# Patient Record
Sex: Female | Born: 1985 | Race: Black or African American | Hispanic: No | Marital: Single | Smoking: Current every day smoker
Health system: Southern US, Community
[De-identification: ages and names within clinical notes are randomized; demographics above are authoritative.]

## PROBLEM LIST (undated history)

## (undated) DIAGNOSIS — F319 Bipolar disorder, unspecified: Secondary | ICD-10-CM

## (undated) HISTORY — PX: CHOLECYSTECTOMY: SHX55

---

## 2013-05-24 ENCOUNTER — Emergency Department (HOSPITAL_COMMUNITY)
Admission: EM | Admit: 2013-05-24 | Discharge: 2013-05-24 | Disposition: A | Discharge status: Home / Self Care | Payer: Medicaid Other | Attending: Emergency Medicine | Admitting: Emergency Medicine

## 2013-05-24 ENCOUNTER — Encounter (HOSPITAL_COMMUNITY): Payer: Self-pay | Admitting: Emergency Medicine

## 2013-05-24 DIAGNOSIS — F17200 Nicotine dependence, unspecified, uncomplicated: Secondary | ICD-10-CM | POA: Insufficient documentation

## 2013-05-24 DIAGNOSIS — H9319 Tinnitus, unspecified ear: Secondary | ICD-10-CM | POA: Insufficient documentation

## 2013-05-24 DIAGNOSIS — R350 Frequency of micturition: Secondary | ICD-10-CM | POA: Insufficient documentation

## 2013-05-24 DIAGNOSIS — J020 Streptococcal pharyngitis: Secondary | ICD-10-CM

## 2013-05-24 LAB — RAPID STREP SCREEN (MED CTR MEBANE ONLY): Streptococcus, Group A Screen (Direct): POSITIVE — AB

## 2013-05-24 MED ORDER — DEXAMETHASONE SODIUM PHOSPHATE 10 MG/ML IJ SOLN
10.0000 mg | Freq: Once | INTRAMUSCULAR | Status: AC
  Administered 2013-05-24: 10 mg via INTRAMUSCULAR
  Filled 2013-05-24: qty 1

## 2013-05-24 MED ORDER — HYDROCODONE-ACETAMINOPHEN 5-325 MG PO TABS: ORAL_TABLET | ORAL | Status: DC

## 2013-05-24 MED ORDER — DEXAMETHASONE SODIUM PHOSPHATE 10 MG/ML IJ SOLN: 10.0000 mg | Freq: Once | INTRAMUSCULAR | Status: DC

## 2013-05-24 MED ORDER — PENICILLIN G BENZATHINE 1200000 UNIT/2ML IM SUSP
1.2000 10*6.[IU] | Freq: Once | INTRAMUSCULAR | Status: AC
  Administered 2013-05-24: 1.2 10*6.[IU] via INTRAMUSCULAR
  Filled 2013-05-24: qty 2

## 2013-05-24 NOTE — ED Notes (Signed)
Pt reports sore throat since yesterday and ringing in right ear.

## 2013-05-24 NOTE — ED Provider Notes (Signed)
CSN: [id number]     Arrival date & time 05/24/13  1043 History  This chart was scribed for non-physician practitioner working with Derwood Kaplan, MD by Ashley Jacobs, ED scribe. This patient was seen in room TR10C/TR10C and the patient's care was started at 11:14 AM.  First MD Initiated Contact with Patient 05/24/13 1101     Chief Complaint  Patient presents with  . Sore Throat   (Consider location/radiation/quality/duration/timing/severity/associated sxs/prior Treatment) The history is provided by the patient and medical records. No language interpreter was used.   HPI Comments: Jackie Nolan is a 27 y.o. female who presents to the Emergency Department complaining of sore throat with onset of yesterday and is much worse this morning upon waking. Pt states having painful swallowing and being unwilling to eat/drink due to pain. Yesterday she noticed white patches to the back of her throat. Yesterday her throat was itchy and today it is painful.  She has the associated symptom of tinnitus in her right ear. Pt states having urinary frequency and chills. She has medical allergies to ibuprofen. Pt does not have any prior medical complications. She smokes tobacco everyday and does not drink alcohol. Pt tried oral sex for the first time the night prior to the onset of her symptoms. She is concerned that her infection is an STD.   History reviewed. No pertinent past medical history. Past Surgical History  Procedure Laterality Date  . Cholecystectomy     No family history on file. History  Substance Use Topics  . Smoking status: Current Every Day Smoker  . Smokeless tobacco: Not on file  . Alcohol Use: No   OB History   Grav Para Term Preterm Abortions TAB SAB Ect Mult Living                 Review of Systems  Constitutional: Positive for chills. Negative for fever and fatigue.  HENT: Positive for sore throat, tinnitus and trouble swallowing. Negative for congestion, ear pain, rhinorrhea  and sinus pressure.   Eyes: Negative for redness.  Respiratory: Negative for cough and wheezing.   Gastrointestinal: Negative for nausea, vomiting, abdominal pain and diarrhea.  Genitourinary: Negative for dysuria.  Musculoskeletal: Negative for myalgias and neck stiffness.  Skin: Negative for rash.  Neurological: Negative for headaches.  Hematological: Negative for adenopathy.  All other systems reviewed and are negative.    Allergies  Ibuprofen  Home Medications   Current Outpatient Rx  Name  Route  Sig  Dispense  Refill  . HYDROcodone-acetaminophen (NORCO/VICODIN) 5-325 MG per tablet      Take 1-2 tablets every 6 hours as needed for severe pain   8 tablet   0    BP 132/87  Pulse 88  Temp(Src) 98 F (36.7 C) (Oral)  Resp 16  SpO2 99% Physical Exam  Nursing note and vitals reviewed. Constitutional: She appears well-developed and well-nourished. No distress.  HENT:  Head: Normocephalic and atraumatic.  Right Ear: Tympanic membrane, external ear and ear canal normal.  Left Ear: Tympanic membrane, external ear and ear canal normal.  Nose: Nose normal. No mucosal edema or rhinorrhea.  Mouth/Throat: Uvula is midline and mucous membranes are normal. Mucous membranes are not dry. No oral lesions. No trismus in the jaw. No uvula swelling. Oropharyngeal exudate, posterior oropharyngeal edema and posterior oropharyngeal erythema present. No tonsillar abscesses.  Eyes: Conjunctivae and EOM are normal. Pupils are equal, round, and reactive to light. Right eye exhibits no discharge. Left eye exhibits no discharge.  Neck: Normal range of motion. Neck supple. No tracheal deviation present.  Cardiovascular: Normal rate, regular rhythm and normal heart sounds.   Pulmonary/Chest: Effort normal and breath sounds normal. No respiratory distress. She has no wheezes. She has no rales.  Abdominal: Soft. She exhibits no distension. There is no tenderness.  Musculoskeletal: Normal range of  motion.  Lymphadenopathy:    She has cervical adenopathy.  Neurological: She is alert.  Skin: Skin is warm and dry.  Psychiatric: She has a normal mood and affect. Her behavior is normal.    ED Course  Procedures (including critical care time) DIAGNOSTIC STUDIES: Oxygen Saturation is 99% on room air, normal by my interpretation.    COORDINATION OF CARE: 11:27 AM Discussed course of care with pt . Pt understands and agrees.  Labs Review Labs Reviewed  RAPID STREP SCREEN   Imaging Review No results found.  EKG Interpretation   None      Vital signs reviewed and are as follows: Filed Vitals:   05/24/13 1052  BP: 132/87  Pulse: 88  Temp: 98 F (36.7 C)  Resp: 16   CENTOR 3/4, postive strep test: will treat with abx and decadron given degree of swelling.    Patient urged to return with worsening symptoms, persistent fever, unable to swallow or other concerns. Patient verbalized understanding and agrees with plan.    MDM   1. Streptococcal pharyngitis    Positive strep and exam consistent with streptococcal pharyngitis. No complication of abscess. Doubt gonococcal pharyngitis given she would have had less than 1 day incubation and positive strep screen.   I personally performed the services described in this documentation, which was scribed in my presence. The recorded information has been reviewed and is accurate.    Renne Crigler, PA-C 05/24/13 1559

## 2013-05-24 NOTE — ED Provider Notes (Signed)
Medical screening examination/treatment/procedure(s) were performed by non-physician practitioner and as supervising physician I was immediately available for consultation/collaboration.  EKG Interpretation   None        Derwood Kaplan, MD 05/24/13 1647

## 2013-10-24 ENCOUNTER — Emergency Department (HOSPITAL_COMMUNITY)
Admission: EM | Admit: 2013-10-24 | Discharge: 2013-10-24 | Disposition: A | Discharge status: Home / Self Care | Payer: Medicaid Other | Attending: Emergency Medicine | Admitting: Emergency Medicine

## 2013-10-24 ENCOUNTER — Encounter (HOSPITAL_COMMUNITY): Payer: Self-pay | Admitting: Emergency Medicine

## 2013-10-24 DIAGNOSIS — J020 Streptococcal pharyngitis: Secondary | ICD-10-CM | POA: Insufficient documentation

## 2013-10-24 DIAGNOSIS — R61 Generalized hyperhidrosis: Secondary | ICD-10-CM | POA: Insufficient documentation

## 2013-10-24 DIAGNOSIS — O98819 Other maternal infectious and parasitic diseases complicating pregnancy, unspecified trimester: Secondary | ICD-10-CM | POA: Insufficient documentation

## 2013-10-24 DIAGNOSIS — O99330 Smoking (tobacco) complicating pregnancy, unspecified trimester: Secondary | ICD-10-CM | POA: Insufficient documentation

## 2013-10-24 DIAGNOSIS — O9989 Other specified diseases and conditions complicating pregnancy, childbirth and the puerperium: Secondary | ICD-10-CM | POA: Insufficient documentation

## 2013-10-24 DIAGNOSIS — H6120 Impacted cerumen, unspecified ear: Secondary | ICD-10-CM | POA: Insufficient documentation

## 2013-10-24 DIAGNOSIS — Z8659 Personal history of other mental and behavioral disorders: Secondary | ICD-10-CM | POA: Insufficient documentation

## 2013-10-24 HISTORY — DX: Bipolar disorder, unspecified: F31.9

## 2013-10-24 LAB — RAPID STREP SCREEN (MED CTR MEBANE ONLY): STREPTOCOCCUS, GROUP A SCREEN (DIRECT): POSITIVE — AB

## 2013-10-24 MED ORDER — LIDOCAINE VISCOUS 2 % MT SOLN
15.0000 mL | Freq: Once | OROMUCOSAL | Status: AC
  Administered 2013-10-24: 15 mL via OROMUCOSAL
  Filled 2013-10-24: qty 15

## 2013-10-24 MED ORDER — PENICILLIN G BENZATHINE 1200000 UNIT/2ML IM SUSP
1.2000 10*6.[IU] | Freq: Once | INTRAMUSCULAR | Status: AC
  Administered 2013-10-24: 1.2 10*6.[IU] via INTRAMUSCULAR
  Filled 2013-10-24: qty 2

## 2013-10-24 MED ORDER — LIDOCAINE VISCOUS 2 % MT SOLN: 20.0000 mL | OROMUCOSAL | Status: DC | PRN

## 2013-10-24 NOTE — ED Notes (Signed)
Patient states has had sore throat x 2 days.   Patient states her R ear is now hurting also.

## 2013-10-24 NOTE — Discharge Instructions (Signed)
Please follow up with your primary care physician in 1-2 days. If you do not have one please [facility] the [facility] number listed above. Please use Xylocaine as prescribed to help with sore throat. A list of approved over the counter medications has been attached for symptomatic care. Please read all discharge instructions and return precautions.    Pharyngitis Pharyngitis is redness, pain, and swelling (inflammation) of your pharynx.  CAUSES  Pharyngitis is usually caused by infection. Most of the time, these infections are from viruses (viral) and are part of a cold. However, sometimes pharyngitis is caused by bacteria (bacterial). Pharyngitis can also be caused by allergies. Viral pharyngitis may be spread from person to person by coughing, sneezing, and personal items or utensils (cups, forks, spoons, toothbrushes). Bacterial pharyngitis may be spread from person to person by more intimate contact, such as kissing.  SIGNS AND SYMPTOMS  Symptoms of pharyngitis include:   Sore throat.   Tiredness (fatigue).   Low-grade fever.   Headache.  Joint pain and muscle aches.  Skin rashes.  Swollen lymph nodes.  Plaque-like film on throat or tonsils (often seen with bacterial pharyngitis). DIAGNOSIS  Your health care provider will ask you questions about your illness and your symptoms. Your medical history, along with a physical exam, is often all that is needed to diagnose pharyngitis. Sometimes, a rapid strep test is done. Other lab tests may also be done, depending on the suspected cause.  TREATMENT  Viral pharyngitis will usually get better in 3 4 days without the use of medicine. Bacterial pharyngitis is treated with medicines that kill germs (antibiotics).  HOME CARE INSTRUCTIONS   Drink enough water and fluids to keep your urine clear or pale yellow.   Only take over-the-counter or prescription medicines as directed by your health care provider:   If you are  prescribed antibiotics, make sure you finish them even if you start to feel better.   Do not take aspirin.   Get lots of rest.   Gargle with 8 oz of salt water ( tsp of salt per 1 qt of water) as often as every 1 2 hours to soothe your throat.   Throat lozenges (if you are not at risk for choking) or sprays may be used to soothe your throat. SEEK MEDICAL CARE IF:   You have large, tender lumps in your neck.  You have a rash.  You cough up green, yellow-brown, or bloody spit. SEEK IMMEDIATE MEDICAL CARE IF:   Your neck becomes stiff.  You drool or are unable to swallow liquids.  You vomit or are unable to keep medicines or liquids down.  You have severe pain that does not go away with the use of recommended medicines.  You have trouble breathing (not caused by a stuffy nose). MAKE SURE YOU:   Understand these instructions.  Will watch your condition.  Will get help right away if you are not doing well or get worse. Document Released: 06/02/2005 Document Revised: 03/23/2013 Document Reviewed: 02/07/2013 [facility] Patient Information 2014 [employer] [place], [place].

## 2013-10-24 NOTE — ED Provider Notes (Signed)
Medical screening examination/treatment/procedure(s) were performed by non-physician practitioner and as supervising physician I was immediately available for consultation/collaboration.   EKG Interpretation None        Vidit Boissonneault, MD 10/24/13 1612 

## 2013-10-24 NOTE — ED Provider Notes (Signed)
CSN: [id number]     Arrival date & time 10/24/13  0929 History   This chart was scribed for non-physician practitioner working with Glynn OctaveStephen Rancour, MD, by Jarvis Morganaylor Ferguson, ED Scribe. This patient was seen in room TR11C/TR11C and the patient's care was started at 9:52 AM    Chief Complaint  Patient presents with  . Sore Throat     The history is provided by the patient. No language interpreter was used.   HPI Comments: Jackie JacobsChardae Nolan is a 28 y.o. pregnant female who presents to the Emergency Department complaining of gradually worsening, "burning", sore throat onset 2 days ago. Patient states that she has associated right ear pain, tinnitus/congested sensation, subjective fever. Patient states that she has had strep throat before and it feels similar to that. Patient states that she took Halls at home with mild relief. Patient denies any rhinorrhea, cough or emesis. Patient is approximately 6-[redacted] weeks pregnant, but has no abdominal pain, pelvic pain, vaginal bleeding or discharge.   Past Medical History  Diagnosis Date  . Bipolar disorder    Past Surgical History  Procedure Laterality Date  . Cholecystectomy     No family history on file. History  Substance Use Topics  . Smoking status: Current Every Day Smoker -- 0.50 packs/day    Types: Cigarettes  . Smokeless tobacco: Not on file  . Alcohol Use: Yes   OB History   Grav Para Term Preterm Abortions TAB SAB Ect Mult Living   1              Review of Systems  Constitutional: Positive for fever (subjective) and diaphoresis.  HENT: Positive for ear pain (right ear), sore throat and tinnitus. Negative for rhinorrhea.   Respiratory: Negative for cough.   Gastrointestinal: Negative for vomiting.  All other systems reviewed and are negative.     Allergies  Ibuprofen  Home Medications   Prior to Admission medications   Medication Sig Start Date End Date Taking? Authorizing Provider  HYDROcodone-acetaminophen (NORCO/VICODIN)  5-325 MG per tablet Take 1-2 tablets every 6 hours as needed for severe pain 05/24/13   Renne CriglerJoshua Geiple, PA-C   Triage Vitals: BP 119/72  Pulse 84  Temp(Src) 98.6 F (37 C) (Oral)  Resp 18  Ht 5\' 5"  (1.651 m)  Wt 140 lb (63.504 kg)  BMI 23.30 kg/m2  SpO2 100%  Physical Exam  Nursing note and vitals reviewed. Constitutional: She is oriented to person, place, and time. She appears well-developed and well-nourished. No distress.  HENT:  Head: Normocephalic and atraumatic.  Right Ear: External ear and ear canal normal.  Left Ear: External ear and ear canal normal.  Nose: Nose normal.  Mouth/Throat: Uvula is midline and mucous membranes are normal. No trismus in the jaw. No uvula swelling. Oropharyngeal exudate and posterior oropharyngeal erythema present. No posterior oropharyngeal edema or tonsillar abscesses.  Bilateral cerumen impaction  Eyes: Conjunctivae are normal.  Neck: Normal range of motion. Neck supple.  Cardiovascular: Normal rate, regular rhythm and normal heart sounds.   Pulmonary/Chest: Effort normal and breath sounds normal.  Abdominal: Soft.  Musculoskeletal: Normal range of motion.  Lymphadenopathy:    She has cervical adenopathy.  Neurological: She is alert and oriented to person, place, and time. Gait normal. GCS eye subscore is 4. GCS verbal subscore is 5. GCS motor subscore is 6.  Skin: Skin is warm and dry. She is not diaphoretic.  Psychiatric: She has a normal mood and affect.    ED Course  Procedures (including critical care time) Medications  lidocaine (XYLOCAINE) 2 % viscous mouth solution 15 mL (15 mLs Mouth/Throat Given 10/24/13 1008)  penicillin g benzathine (BICILLIN LA) 1200000 UNIT/2ML injection 1.2 Million Units (1.2 Million Units Intramuscular Given 10/24/13 1022)     DIAGNOSTIC STUDIES: Oxygen Saturation is 100% on RA, normal by my interpretation.    COORDINATION OF CARE: 9:58 AM Will order strep test and lidocaine rinse.  Pt advised of plan  for treatment and pt agrees.  10:12 AM Diagnostic lab work showed pt positive for strep. Will order penicillin injection.     Labs Review Labs Reviewed  RAPID STREP SCREEN - Abnormal; Notable for the following:    Streptococcus, Group A Screen (Direct) POSITIVE (*)    All other components within normal limits    Imaging Review No results found.   EKG Interpretation None      MDM   Final diagnoses:  Strep pharyngitis    Filed Vitals:   10/24/13 1043  BP: 115/77  Pulse: 73  Temp:   Resp: 18   Pt with tonsillar exudate, cervical lymphadenopathy, & dysphagia; diagnosis of strep. Treated in the Ed with xylocaine and PCN IM.  Pt appears mildly dehydrated, discussed importance of water rehydration. Presentation non concerning for PTA or infxn spread to soft tissue. No trismus or uvula deviation. Specific return precautions discussed. Pt able to drink water in ED without difficulty with intact air way. Recommended PCP follow up. Patient is agreeable to plan. Patient is stable at time of discharge    I personally performed the services described in this documentation, which was scribed in my presence. The recorded information has been reviewed and is accurate.      Lise AuerJennifer L Yvett Rossel, PA-C 10/24/13 1050

## 2013-10-28 ENCOUNTER — Encounter (HOSPITAL_COMMUNITY): Payer: Self-pay | Admitting: Emergency Medicine

## 2013-10-28 ENCOUNTER — Emergency Department (HOSPITAL_COMMUNITY): Payer: Medicaid Other

## 2013-10-28 ENCOUNTER — Emergency Department (HOSPITAL_COMMUNITY)
Admission: EM | Admit: 2013-10-28 | Discharge: 2013-10-28 | Disposition: A | Discharge status: Home / Self Care | Payer: Medicaid Other | Attending: Emergency Medicine | Admitting: Emergency Medicine

## 2013-10-28 DIAGNOSIS — S93409A Sprain of unspecified ligament of unspecified ankle, initial encounter: Secondary | ICD-10-CM | POA: Insufficient documentation

## 2013-10-28 DIAGNOSIS — Z8659 Personal history of other mental and behavioral disorders: Secondary | ICD-10-CM | POA: Insufficient documentation

## 2013-10-28 DIAGNOSIS — F17200 Nicotine dependence, unspecified, uncomplicated: Secondary | ICD-10-CM | POA: Insufficient documentation

## 2013-10-28 NOTE — Discharge Instructions (Signed)
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
## 2013-10-29 NOTE — ED Provider Notes (Signed)
Medical screening examination/treatment/procedure(s) were performed by non-physician practitioner and as supervising physician I was immediately available for consultation/collaboration.  Terrah Decoster L Fantasia Jinkins, MD 10/29/13 0127 

## 2014-01-03 ENCOUNTER — Encounter (HOSPITAL_COMMUNITY): Payer: Self-pay | Admitting: Emergency Medicine

## 2014-01-03 ENCOUNTER — Emergency Department (HOSPITAL_COMMUNITY)
Admission: EM | Admit: 2014-01-03 | Discharge: 2014-01-03 | Disposition: A | Discharge status: Home / Self Care | Payer: Medicaid Other | Attending: Emergency Medicine | Admitting: Emergency Medicine

## 2014-01-03 DIAGNOSIS — R52 Pain, unspecified: Secondary | ICD-10-CM | POA: Insufficient documentation

## 2014-01-03 DIAGNOSIS — Z79899 Other long term (current) drug therapy: Secondary | ICD-10-CM | POA: Diagnosis not present

## 2014-01-03 DIAGNOSIS — R112 Nausea with vomiting, unspecified: Secondary | ICD-10-CM

## 2014-01-03 DIAGNOSIS — F17200 Nicotine dependence, unspecified, uncomplicated: Secondary | ICD-10-CM | POA: Diagnosis not present

## 2014-01-03 DIAGNOSIS — Z8659 Personal history of other mental and behavioral disorders: Secondary | ICD-10-CM | POA: Insufficient documentation

## 2014-01-03 DIAGNOSIS — R197 Diarrhea, unspecified: Secondary | ICD-10-CM | POA: Insufficient documentation

## 2014-01-03 LAB — I-STAT CHEM 8, ED
BUN: 11 mg/dL (ref 6–23)
CALCIUM ION: 1.17 mmol/L (ref 1.12–1.23)
CHLORIDE: 106 meq/L (ref 96–112)
Creatinine, Ser: 0.6 mg/dL (ref 0.50–1.10)
Glucose, Bld: 77 mg/dL (ref 70–99)
HEMATOCRIT: 39 % (ref 36.0–46.0)
HEMOGLOBIN: 13.3 g/dL (ref 12.0–15.0)
Potassium: 4.2 mEq/L (ref 3.7–5.3)
Sodium: 139 mEq/L (ref 137–147)
TCO2: 25 mmol/L (ref 0–100)

## 2014-01-03 MED ORDER — SODIUM CHLORIDE 0.9 % IV BOLUS (SEPSIS)
1000.0000 mL | Freq: Once | INTRAVENOUS | Status: AC
  Administered 2014-01-03: 1000 mL via INTRAVENOUS

## 2014-01-03 MED ORDER — ONDANSETRON HCL 4 MG PO TABS: 4.0000 mg | ORAL_TABLET | Freq: Four times a day (QID) | ORAL | Status: AC

## 2014-01-03 MED ORDER — ONDANSETRON HCL 4 MG/2ML IJ SOLN
4.0000 mg | Freq: Once | INTRAMUSCULAR | Status: AC
  Administered 2014-01-03: 4 mg via INTRAVENOUS
  Filled 2014-01-03: qty 2

## 2014-01-03 NOTE — ED Provider Notes (Signed)
CSN: [id number]     Arrival date & time 01/03/14  1034 History   First MD Initiated Contact with Patient 01/03/14 1037     Chief Complaint  Patient presents with  . Nausea  . Emesis  . Diarrhea  . Generalized Body Aches     (Consider location/radiation/quality/duration/timing/severity/associated sxs/prior Treatment) HPI Comments: Patient presents today with a chief complaint of nausea, vomiting, and diarrhea.  She reports onset of symptoms last evening.  She states that she has had numerous episodes of vomiting and three episodes of diarrhea.  She denies any blood in her emesis or blood in her stool.  She has not taken anything for her symptoms prior to arrival.  She denies any known sick contacts.  No recent hospitalizations or foreign travel.  She denies abdominal pain, fever, chills, or urinary symptoms.  LMP was 12/29/13.  PMH significant for Cholecystectomy in 2005.  No other abdominal surgeries.    The history is provided by the patient.    Past Medical History  Diagnosis Date  . Bipolar disorder    Past Surgical History  Procedure Laterality Date  . Cholecystectomy     No family history on file. History  Substance Use Topics  . Smoking status: Current Every Day Smoker -- 0.50 packs/day    Types: Cigarettes  . Smokeless tobacco: Not on file  . Alcohol Use: Yes     Comment: social    OB History   Grav Para Term Preterm Abortions TAB SAB Ect Mult Living   1              Review of Systems  Constitutional: Negative for fever and chills.  Gastrointestinal: Positive for nausea, vomiting and diarrhea. Negative for abdominal pain.  Genitourinary: Negative for dysuria, urgency, frequency, hematuria and flank pain.  All other systems reviewed and are negative.     Allergies  Ibuprofen  Home Medications   Prior to Admission medications   Medication Sig Start Date End Date Taking? Authorizing Provider  ferrous sulfate 325 (65 FE) MG tablet Take 325 mg by mouth daily  with breakfast.    Historical Provider, MD  Prenatal Vit-Fe Fumarate-FA (PRENATAL MULTIVITAMIN) TABS tablet Take 1 tablet by mouth daily at 12 noon.    Historical Provider, MD   BP 120/72  Pulse 60  Temp(Src) 98 F (36.7 C) (Oral)  Resp 16  SpO2 100%  LMP 12/30/2013  Breastfeeding? Unknown Physical Exam  Nursing note and vitals reviewed. Constitutional: She appears well-developed and well-nourished.  HENT:  Head: Normocephalic and atraumatic.  Mouth/Throat: Oropharynx is clear and moist.  Neck: Normal range of motion. Neck supple.  Cardiovascular: Normal rate, regular rhythm and normal heart sounds.   Pulmonary/Chest: Effort normal and breath sounds normal.  Abdominal: Soft. Bowel sounds are normal. She exhibits no distension and no mass. There is no tenderness. There is no rebound and no guarding.  Neurological: She is alert.  Skin: Skin is warm and dry.  Psychiatric: She has a normal mood and affect.    ED Course  Procedures (including critical care time) Labs Review Labs Reviewed - No data to display  Imaging Review No results found.   EKG Interpretation None     12:30 PM Patient reports significant improvement in nausea.  Abdomen soft and nontender.  Will PO challenge and reassess.   1:00 PM Patient able to tolerate PO liquids.   MDM   Final diagnoses:  None   Patient presenting with a chief complaint of nausea,  vomiting, and diarrhea.  Patient afebrile.  Labs unremarkable.  No abdominal pain on exam.  Therefore, do not feel that any imaging is indicated at this time.  Suspect Viral Gastroenteritis.  Symptoms improved after given IV Zofran and IVF.  No vomiting during ED course.  Patient able to tolerate PO liquids.  Feel that the patient is stable for discharge.  Return precautions given.    Santiago Glad, PA-C 01/03/14 1911

## 2014-01-03 NOTE — ED Notes (Signed)
Pt brought in by [facility]. Complaints of N/V/D starting last night. Pt also has generalized body aches and chills.

## 2014-01-03 NOTE — ED Notes (Signed)
Bed: [id number] Expected date: 01/03/14 Expected time: 10:19 AM Means of arrival:  Comments: Female from home n/v fever

## 2014-01-03 NOTE — ED Notes (Signed)
Made patient aware that we need a urine sample 

## 2014-01-04 NOTE — ED Provider Notes (Signed)
Medical screening examination/treatment/procedure(s) were performed by non-physician practitioner and as supervising physician I was immediately available for consultation/collaboration.   EKG Interpretation None        Isaiha Asare N Garen Woolbright, DO 01/04/14 0658 

## 2014-04-17 ENCOUNTER — Encounter (HOSPITAL_COMMUNITY): Payer: Self-pay | Admitting: Emergency Medicine

## 2014-08-02 ENCOUNTER — Encounter (HOSPITAL_COMMUNITY): Payer: Self-pay | Admitting: *Deleted

## 2014-08-02 ENCOUNTER — Emergency Department (HOSPITAL_COMMUNITY)
Admission: EM | Admit: 2014-08-02 | Discharge: 2014-08-03 | Disposition: A | Discharge status: Home / Self Care | Payer: Medicaid Other | Attending: Emergency Medicine | Admitting: Emergency Medicine

## 2014-08-02 DIAGNOSIS — Z9049 Acquired absence of other specified parts of digestive tract: Secondary | ICD-10-CM | POA: Diagnosis not present

## 2014-08-02 DIAGNOSIS — N12 Tubulo-interstitial nephritis, not specified as acute or chronic: Secondary | ICD-10-CM | POA: Insufficient documentation

## 2014-08-02 DIAGNOSIS — Z8659 Personal history of other mental and behavioral disorders: Secondary | ICD-10-CM | POA: Diagnosis not present

## 2014-08-02 DIAGNOSIS — D72829 Elevated white blood cell count, unspecified: Secondary | ICD-10-CM | POA: Insufficient documentation

## 2014-08-02 DIAGNOSIS — Z720 Tobacco use: Secondary | ICD-10-CM | POA: Diagnosis not present

## 2014-08-02 DIAGNOSIS — Z3202 Encounter for pregnancy test, result negative: Secondary | ICD-10-CM | POA: Diagnosis not present

## 2014-08-02 DIAGNOSIS — R109 Unspecified abdominal pain: Secondary | ICD-10-CM | POA: Diagnosis present

## 2014-08-02 LAB — CBC WITH DIFFERENTIAL/PLATELET
BASOS ABS: 0 10*3/uL (ref 0.0–0.1)
Basophils Relative: 0 % (ref 0–1)
EOS ABS: 0.2 10*3/uL (ref 0.0–0.7)
EOS PCT: 3 % (ref 0–5)
HEMATOCRIT: 39.2 % (ref 36.0–46.0)
Hemoglobin: 13.4 g/dL (ref 12.0–15.0)
LYMPHS ABS: 1.9 10*3/uL (ref 0.7–4.0)
Lymphocytes Relative: 31 % (ref 12–46)
MCH: 30.7 pg (ref 26.0–34.0)
MCHC: 34.2 g/dL (ref 30.0–36.0)
MCV: 89.9 fL (ref 78.0–100.0)
MONO ABS: 0.7 10*3/uL (ref 0.1–1.0)
Monocytes Relative: 11 % (ref 3–12)
Neutro Abs: 3.4 10*3/uL (ref 1.7–7.7)
Neutrophils Relative %: 55 % (ref 43–77)
Platelets: 327 10*3/uL (ref 150–400)
RBC: 4.36 MIL/uL (ref 3.87–5.11)
RDW: 13.5 % (ref 11.5–15.5)
WBC: 6.2 10*3/uL (ref 4.0–10.5)

## 2014-08-02 LAB — COMPREHENSIVE METABOLIC PANEL
ALT: 10 U/L (ref 0–35)
ANION GAP: 9 (ref 5–15)
AST: 15 U/L (ref 0–37)
Albumin: 4.2 g/dL (ref 3.5–5.2)
Alkaline Phosphatase: 42 U/L (ref 39–117)
BUN: 7 mg/dL (ref 6–23)
CO2: 22 mmol/L (ref 19–32)
Calcium: 9.5 mg/dL (ref 8.4–10.5)
Chloride: 106 mmol/L (ref 96–112)
Creatinine, Ser: 0.77 mg/dL (ref 0.50–1.10)
GFR calc Af Amer: >90 mL/min (ref >90)
GFR calc non Af Amer: >90 mL/min (ref >90)
Glucose, Bld: 95 mg/dL (ref 70–99)
POTASSIUM: 3.5 mmol/L (ref 3.5–5.1)
SODIUM: 137 mmol/L (ref 135–145)
TOTAL PROTEIN: 7.6 g/dL (ref 6.0–8.3)
Total Bilirubin: 0.8 mg/dL (ref 0.3–1.2)

## 2014-08-02 LAB — URINALYSIS, ROUTINE W REFLEX MICROSCOPIC
GLUCOSE, UA: NEGATIVE mg/dL
KETONES UR: 15 mg/dL — AB
Nitrite: NEGATIVE
PROTEIN: NEGATIVE mg/dL
Specific Gravity, Urine: 1.029 (ref 1.005–1.030)
Urobilinogen, UA: 1 mg/dL (ref 0.0–1.0)
pH: 5.5 (ref 5.0–8.0)

## 2014-08-02 LAB — LIPASE, BLOOD: Lipase: 22 U/L (ref 11–59)

## 2014-08-02 LAB — PREGNANCY, URINE: Preg Test, Ur: NEGATIVE

## 2014-08-02 LAB — URINE MICROSCOPIC-ADD ON

## 2014-08-02 MED ORDER — HYDROMORPHONE HCL 1 MG/ML IJ SOLN
1.0000 mg | Freq: Once | INTRAMUSCULAR | Status: AC
  Administered 2014-08-02: 1 mg via INTRAVENOUS
  Filled 2014-08-02: qty 1

## 2014-08-02 MED ORDER — ONDANSETRON HCL 4 MG/2ML IJ SOLN
4.0000 mg | Freq: Once | INTRAMUSCULAR | Status: AC
Start: 2014-08-02 — End: 2014-08-02
  Administered 2014-08-02: 4 mg via INTRAVENOUS
  Filled 2014-08-02: qty 2

## 2014-08-02 MED ORDER — DEXTROSE 5 % IV SOLN
1.0000 g | Freq: Once | INTRAVENOUS | Status: AC
  Administered 2014-08-02: 1 g via INTRAVENOUS
  Filled 2014-08-02: qty 10

## 2014-08-02 MED ORDER — MORPHINE SULFATE 4 MG/ML IJ SOLN
4.0000 mg | Freq: Once | INTRAMUSCULAR | Status: AC
  Administered 2014-08-02: 4 mg via INTRAVENOUS
  Filled 2014-08-02: qty 1

## 2014-08-02 MED ORDER — ONDANSETRON HCL 4 MG/2ML IJ SOLN: 4.0000 mg | Freq: Once | INTRAMUSCULAR | Status: DC

## 2014-08-02 MED ORDER — SODIUM CHLORIDE 0.9 % IV BOLUS (SEPSIS)
1000.0000 mL | Freq: Once | INTRAVENOUS | Status: AC
  Administered 2014-08-02: 1000 mL via INTRAVENOUS

## 2014-08-02 NOTE — ED Provider Notes (Signed)
CSN: [id number]     Arrival date & time 08/02/14  1747 History   First MD Initiated Contact with Patient 08/02/14 2101     Chief Complaint  Patient presents with  . Abdominal Pain     (Consider location/radiation/quality/duration/timing/severity/associated sxs/prior Treatment) HPI  PCP: No PCP Per Patient Blood pressure 104/65, pulse 63, temperature 97.8 F (36.6 C), temperature source Oral, resp. rate 18, last menstrual period 07/02/2014, SpO2 99 %, unknown if currently breastfeeding.  Jackie Nolan is a 29 y.o.female with a significant PMH of bipolar disorder presents to the ER with complaints of abdominal pain and left sided flank pain for the past 4 days with intermittent nausea that started a few days ago. She admits to having some dysuria and low back pain associated with it.   Negative Review of Symptoms: fevers, nausea, vomiting,CP, SOB, confusion, weakness, vaginal discharge or vaginal bleeding.  Past Medical History  Diagnosis Date  . Bipolar disorder    Past Surgical History  Procedure Laterality Date  . Cholecystectomy     No family history on file. History  Substance Use Topics  . Smoking status: Current Every Day Smoker -- 0.50 packs/day    Types: Cigarettes  . Smokeless tobacco: Not on file  . Alcohol Use: Yes     Comment: social    OB History    Gravida Para Term Preterm AB TAB SAB Ectopic Multiple Living   1              Review of Systems  10 Systems reviewed and are negative for acute change except as noted in the HPI.     Allergies  Ibuprofen  Home Medications   Prior to Admission medications   Medication Sig Start Date End Date Taking? Authorizing Provider  ciprofloxacin (CIPRO) 500 MG tablet Take 1 tablet (500 mg total) by mouth 2 (two) times daily. 08/03/14   Kebin Maye Irine SealG Wilfrido Luedke, PA-C  ondansetron (ZOFRAN) 4 MG tablet Take 1 tablet (4 mg total) by mouth every 6 (six) hours. 01/03/14   Heather Laisure, PA-C  ondansetron (ZOFRAN) 4 MG tablet Take  1 tablet (4 mg total) by mouth every 6 (six) hours. 08/03/14   Janie Strothman Irine SealG Zan Orlick, PA-C  oxyCODONE-acetaminophen (PERCOCET/ROXICET) 5-325 MG per tablet Take 1-2 tablets by mouth every 6 (six) hours as needed. 08/03/14   Alysah Carton Irine SealG Yousuf Ager, PA-C   BP 103/65 mmHg  Pulse 59  Temp(Src) 97.8 F (36.6 C) (Oral)  Resp 18  SpO2 98%  LMP 07/02/2014 Physical Exam  Constitutional: She appears well-developed and well-nourished. No distress.  HENT:  Head: Normocephalic and atraumatic.  Eyes: Pupils are equal, round, and reactive to light.  Neck: Normal range of motion. Neck supple.  Cardiovascular: Normal rate and regular rhythm.   Pulmonary/Chest: Effort normal.  Abdominal: Soft. Bowel sounds are normal. There is no tenderness. There is CVA tenderness (left worse than right). There is no rigidity, no guarding and negative Murphy's sign.  Neurological: She is alert.  Skin: Skin is warm and dry.  Nursing note and vitals reviewed.   ED Course  Procedures (including critical care time) Labs Review Labs Reviewed  URINALYSIS, ROUTINE W REFLEX MICROSCOPIC - Abnormal; Notable for the following:    Color, Urine AMBER (*)    APPearance CLOUDY (*)    Hgb urine dipstick TRACE (*)    Bilirubin Urine SMALL (*)    Ketones, ur 15 (*)    Leukocytes, UA MODERATE (*)    All other components within normal limits  URINE MICROSCOPIC-ADD ON - Abnormal; Notable for the following:    Squamous Epithelial / LPF MANY (*)    Bacteria, UA FEW (*)    All other components within normal limits  URINE CULTURE  CBC WITH DIFFERENTIAL/PLATELET  COMPREHENSIVE METABOLIC PANEL  LIPASE, BLOOD  PREGNANCY, URINE    Imaging Review No results found.   EKG Interpretation None      MDM   Final diagnoses:  Pyelonephritis   Patient has a normal lipase, CMP, CBC, Urine prep negative. THe Urinalysis shows moderate leukocytes with 11-20 bacteria. She also has a small amount of trace hgb. Due to the quality of the patients  pain I question if she has possible stone and hydronephrosis. CT abd/pelv without contrast has ruled this out.  Medications  sodium chloride 0.9 % bolus 1,000 mL (0 mLs Intravenous Stopped 08/03/14 0112)  morphine 4 MG/ML injection 4 mg (4 mg Intravenous Given 08/02/14 2148)  ondansetron (ZOFRAN) injection 4 mg (4 mg Intravenous Given 08/02/14 2148)  cefTRIAXone (ROCEPHIN) 1 g in dextrose 5 % 50 mL IVPB (0 g Intravenous Stopped 08/02/14 2351)  HYDROmorphone (DILAUDID) injection 1 mg (1 mg Intravenous Given 08/02/14 2233)    Patient will be treated for Pyelo with IV abx int he ED and rx for Cipro x 2 weeks for home. She is afebrile, nontoxic, her pain is controlled and she tolerating PO at discharge.  28 y.o.Jackie Nolan's evaluation in the Emergency Department is complete. It has been determined that no acute conditions requiring further emergency intervention are present at this time. The patient/guardian have been advised of the diagnosis and plan. We have discussed signs and symptoms that warrant return to the ED, such as changes or worsening in symptoms.  Vital signs are stable at discharge. Filed Vitals:   08/03/14 0030  BP: 103/65  Pulse: 59  Temp:   Resp:     Patient/guardian has voiced understanding and agreed to follow-up with the PCP or specialist.     Dorthula Matas, PA-C 08/07/14 1610  Donnetta Hutching, MD 08/10/14 [phone]

## 2014-08-02 NOTE — ED Notes (Signed)
The pt is c/o abd and lower back pain for 4 days with intermittent nausea lmp last month

## 2014-08-03 ENCOUNTER — Emergency Department (HOSPITAL_COMMUNITY): Payer: Medicaid Other

## 2014-08-03 MED ORDER — CIPROFLOXACIN HCL 500 MG PO TABS: 500.0000 mg | ORAL_TABLET | Freq: Two times a day (BID) | ORAL | Status: AC

## 2014-08-03 MED ORDER — OXYCODONE-ACETAMINOPHEN 5-325 MG PO TABS: 1.0000 | ORAL_TABLET | Freq: Four times a day (QID) | ORAL | Status: AC | PRN

## 2014-08-03 MED ORDER — ONDANSETRON HCL 4 MG PO TABS: 4.0000 mg | ORAL_TABLET | Freq: Four times a day (QID) | ORAL | Status: AC

## 2014-08-03 NOTE — ED Notes (Signed)
Pt ambulated to bathroom 

## 2014-08-03 NOTE — Discharge Instructions (Signed)
Pyelonephritis, Adult °Pyelonephritis is a kidney infection. In general, there are 2 main types of pyelonephritis: °· Infections that come on quickly without any warning (acute pyelonephritis). °· Infections that persist for a long period of time (chronic pyelonephritis). °CAUSES  °Two main causes of pyelonephritis are: °· Bacteria traveling from the bladder to the kidney. This is a problem especially in pregnant women. The urine in the bladder can become filled with bacteria from multiple causes, including: °¨ Inflammation of the prostate gland (prostatitis). °¨ Sexual intercourse in females. °¨ Bladder infection (cystitis). °· Bacteria traveling from the bloodstream to the tissue part of the kidney. °Problems that may increase your risk of getting a kidney infection include: °· Diabetes. °· Kidney stones or bladder stones. °· Cancer. °· Catheters placed in the bladder. °· Other abnormalities of the kidney or ureter. °SYMPTOMS  °· Abdominal pain. °· Pain in the side or flank area. °· Fever. °· Chills. °· Upset stomach. °· Blood in the urine (dark urine). °· Frequent urination. °· Strong or persistent urge to urinate. °· Burning or stinging when urinating. °DIAGNOSIS  °Your caregiver may diagnose your kidney infection based on your symptoms. A urine sample may also be taken. °TREATMENT  °In general, treatment depends on how severe the infection is.  °· If the infection is mild and caught early, your caregiver may treat you with oral antibiotics and send you home. °· If the infection is more severe, the bacteria may have gotten into the bloodstream. This will require intravenous (IV) antibiotics and a hospital stay. Symptoms may include: °¨ High fever. °¨ Severe flank pain. °¨ Shaking chills. °· Even after a hospital stay, your caregiver may require you to be on oral antibiotics for a period of time. °· Other treatments may be required depending upon the cause of the infection. °HOME CARE INSTRUCTIONS  °· Take your  antibiotics as directed. Finish them even if you start to feel better. °· Make an appointment to have your urine checked to make sure the infection is gone. °· Drink enough fluids to keep your urine clear or pale yellow. °· Take medicines for the bladder if you have urgency and frequency of urination as directed by your caregiver. °SEEK IMMEDIATE MEDICAL CARE IF:  °· You have a fever or persistent symptoms for more than 2-3 days. °· You have a fever and your symptoms suddenly get worse. °· You are unable to take your antibiotics or fluids. °· You develop shaking chills. °· You experience extreme weakness or fainting. °· There is no improvement after 2 days of treatment. °MAKE SURE YOU: °· Understand these instructions. °· Will watch your condition. °· Will get help right away if you are not doing well or get worse. °Document Released: 06/02/2005 Document Revised: 12/02/2011 Document Reviewed: 11/06/2010 °[employer]® Patient Information ©2015 [employer]. This information is not intended to replace advice given to you by your health care provider. Make sure you discuss any questions you have with your health care provider. ° °

## 2014-08-04 LAB — URINE CULTURE

## 2016-08-07 IMAGING — CT CT ABD-PELV W/O CM
2 of 4 series · 6 of 46 positions shown, 8 images · non-contrast
Comparison: None.

CLINICAL DATA: Abdomen lower back pain for 4 days. Intermittent
nausea.

EXAM:
CT ABDOMEN AND PELVIS WITHOUT CONTRAST
TECHNIQUE: Multidetector CT imaging of the abdomen and pelvis was performed
following the standard protocol without IV contrast.

[Series 206: coronals · coronal · 0.50mm/px · 5 of 86 slices shown, 6 images]
[im 10/86  soft-tissue]
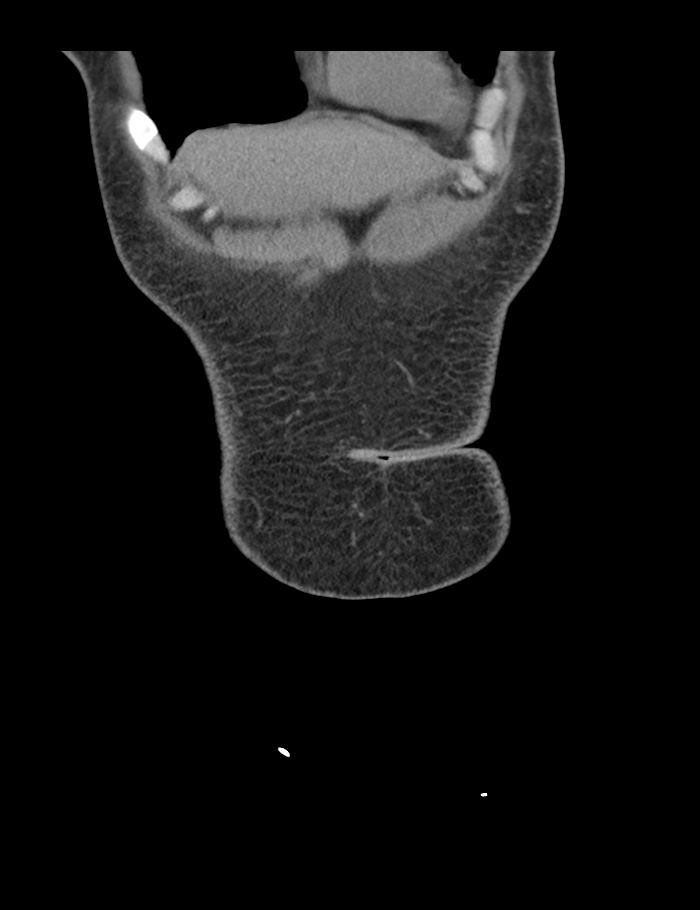
[im 10/86  bone]
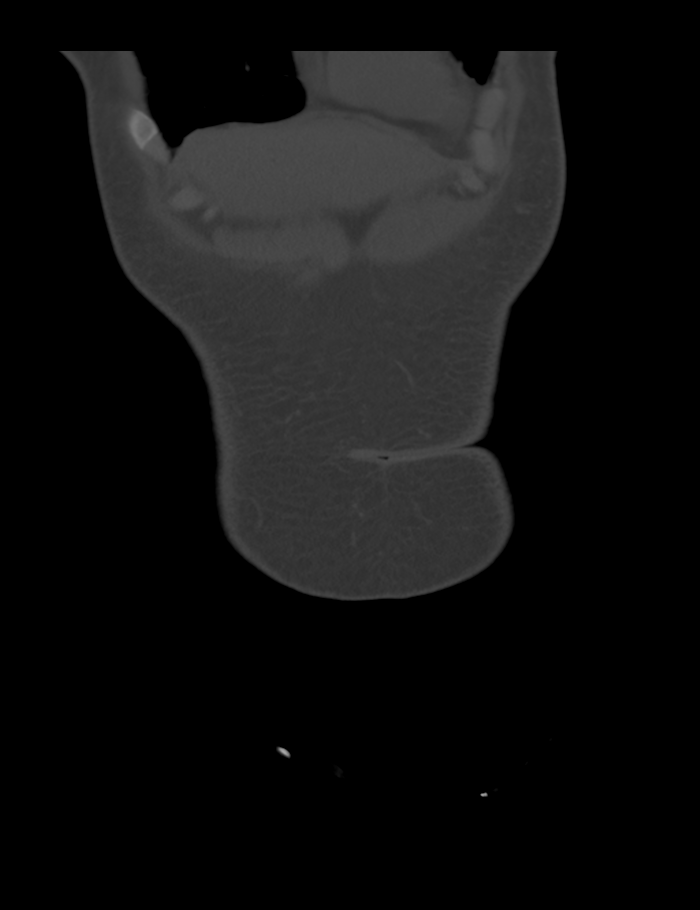
[im 29/86  soft-tissue]
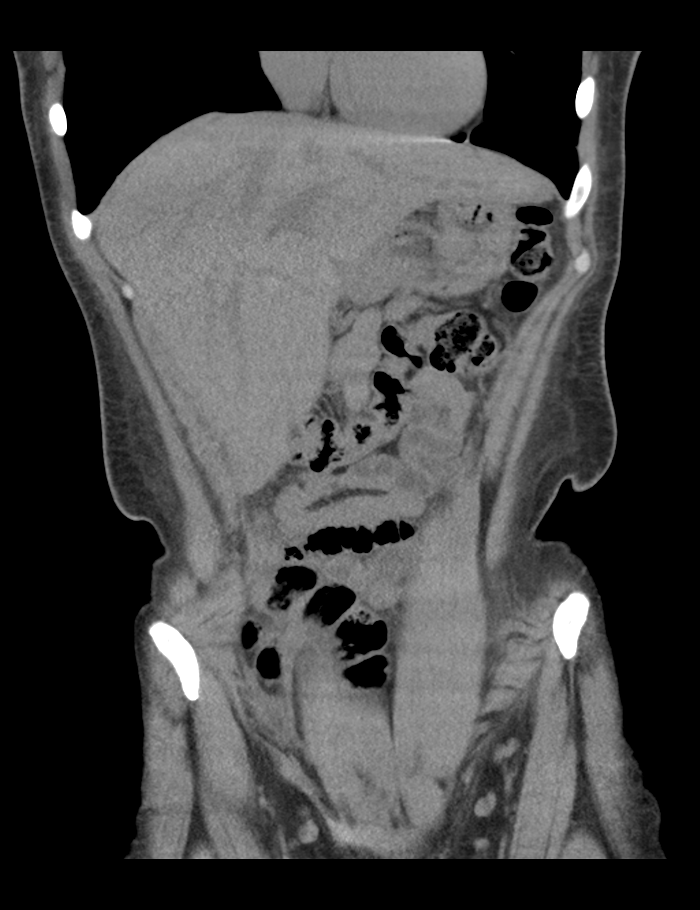
[im 48/86  soft-tissue]
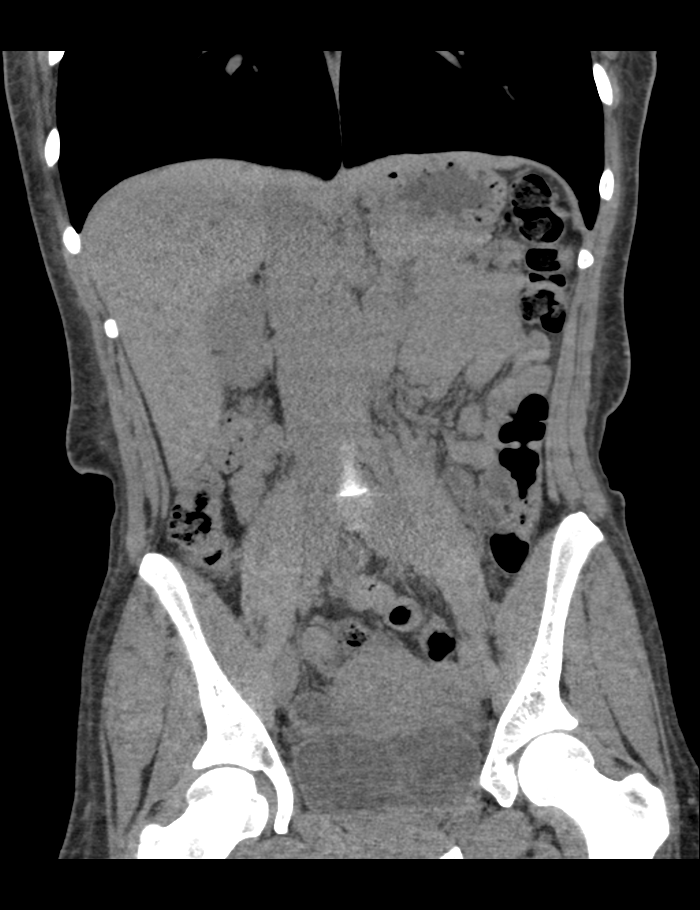
[im 57/86  soft-tissue]
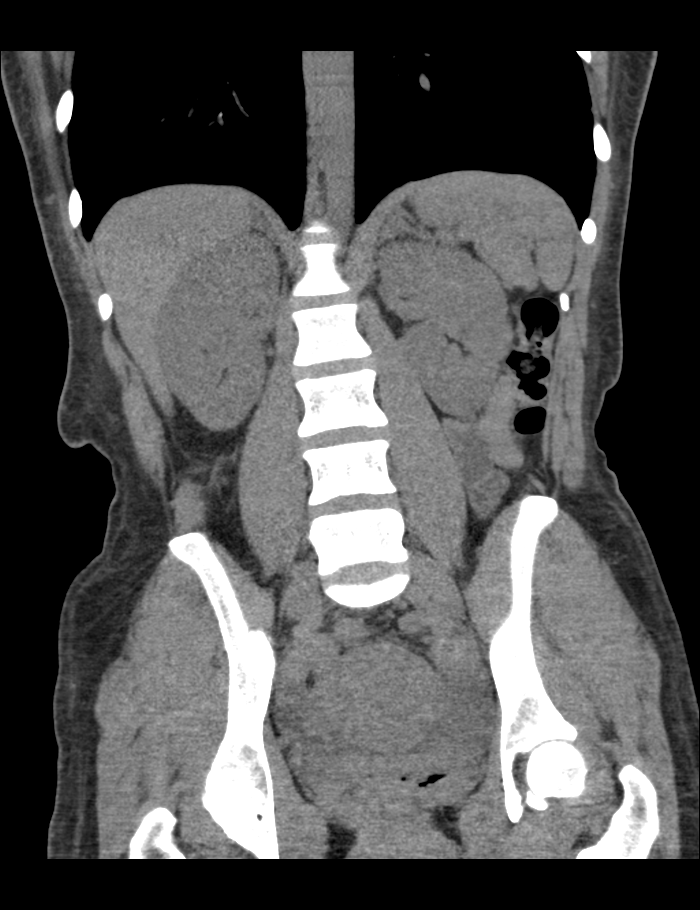
[im 76/86  soft-tissue]
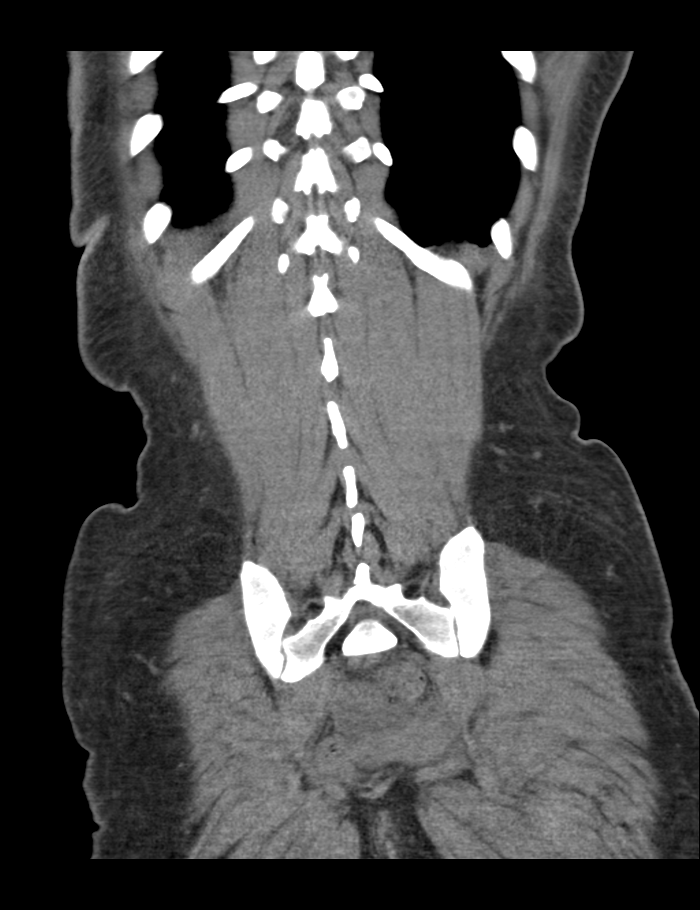

[Series 207: sagittals · sagittal · 0.50mm/px · 1 of 116 slices shown, 2 images]
[im 39/116  soft-tissue]
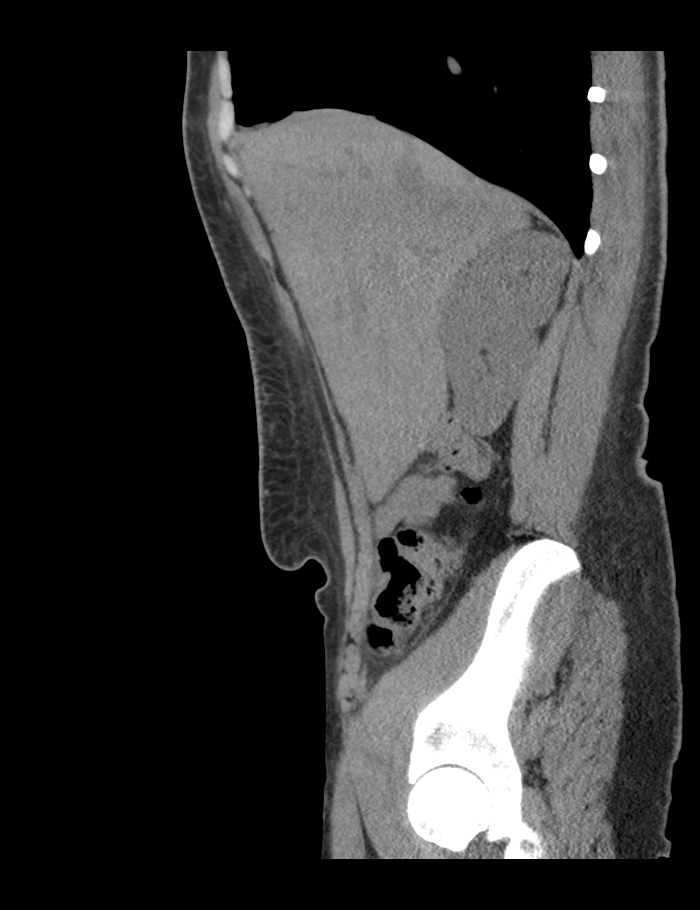
[im 39/116  bone]
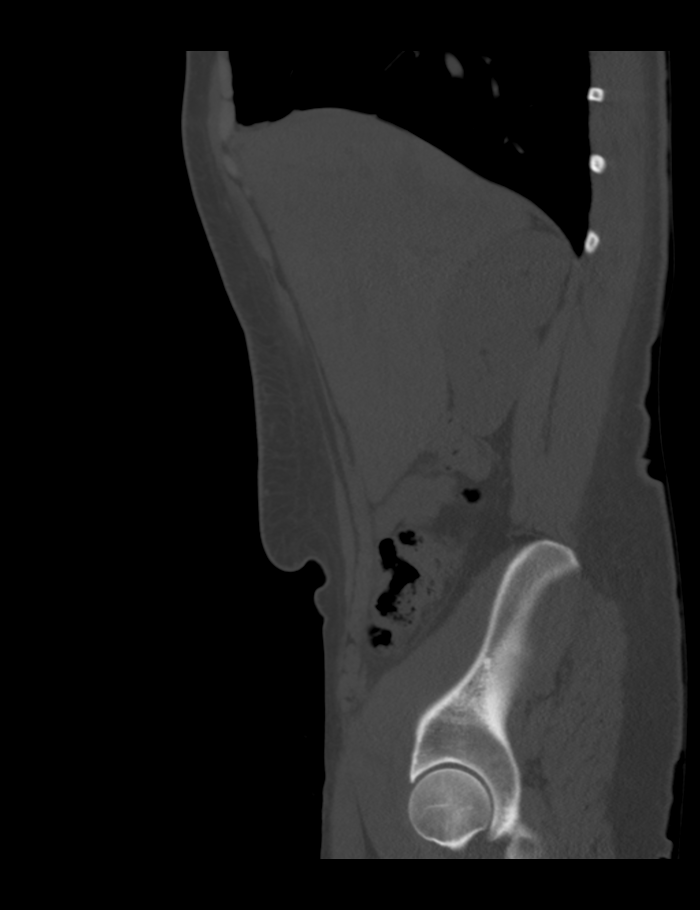

[6 of 46 positions shown; findings below may reference images not displayed]

FINDINGS: There are no urinary calculi. There is no hydronephrosis or ureteral
dilatation. There are unremarkable unenhanced appearances of the
liver, spleen, pancreas and adrenals.Mesentery and bowel appear
unremarkable. No acute inflammatory changes are evident in the
abdomen or pelvis. Uterus and adnexal structures appear
unremarkable. There is prior cholecystectomy. No significant
abnormalities are evident in the lower chest. No significant
musculoskeletal abnormalities are evident.
IMPRESSION: No significant abnormality
